# Patient Record
Sex: Male | Born: 2005 | Race: Asian | Hispanic: No | Marital: Single | Smoking: Never smoker
Health system: Southern US, Community
[De-identification: ages and names within clinical notes are randomized; demographics above are authoritative.]

## PROBLEM LIST (undated history)

## (undated) DIAGNOSIS — J020 Streptococcal pharyngitis: Secondary | ICD-10-CM

## (undated) DIAGNOSIS — J019 Acute sinusitis, unspecified: Secondary | ICD-10-CM

## (undated) DIAGNOSIS — H6690 Otitis media, unspecified, unspecified ear: Secondary | ICD-10-CM

## (undated) DIAGNOSIS — J189 Pneumonia, unspecified organism: Secondary | ICD-10-CM

---
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
## 2023-10-16 ENCOUNTER — Ambulatory Visit (INDEPENDENT_AMBULATORY_CARE_PROVIDER_SITE_OTHER): Payer: Managed Care, Other (non HMO) | Admitting: Family Medicine

## 2023-10-16 ENCOUNTER — Encounter: Payer: Self-pay | Admitting: Family Medicine

## 2023-10-16 VITALS — BP 122/68 | HR 70 | Temp 97.9°F | Ht 71.0 in | Wt 188.0 lb

## 2023-10-16 DIAGNOSIS — Z13220 Encounter for screening for lipoid disorders: Secondary | ICD-10-CM

## 2023-10-16 DIAGNOSIS — Z8249 Family history of ischemic heart disease and other diseases of the circulatory system: Secondary | ICD-10-CM | POA: Diagnosis not present

## 2023-10-16 LAB — CBC WITH DIFFERENTIAL/PLATELET
Basophils Absolute: 0 10*3/uL (ref 0.0–0.1)
Basophils Relative: 0.3 % (ref 0.0–3.0)
Eosinophils Absolute: 0.2 10*3/uL (ref 0.0–0.7)
Eosinophils Relative: 3.4 % (ref 0.0–5.0)
HCT: 47 % (ref 36.0–49.0)
Hemoglobin: 15.3 g/dL (ref 12.0–16.0)
Lymphocytes Relative: 29.8 % (ref 24.0–48.0)
Lymphs Abs: 2.2 10*3/uL (ref 0.7–4.0)
MCHC: 32.5 g/dL (ref 31.0–37.0)
MCV: 87.6 fL (ref 78.0–98.0)
Monocytes Absolute: 0.6 10*3/uL (ref 0.1–1.0)
Monocytes Relative: 7.8 % (ref 3.0–12.0)
Neutro Abs: 4.2 10*3/uL (ref 1.4–7.7)
Neutrophils Relative %: 58.7 % (ref 43.0–71.0)
Platelets: 278 10*3/uL (ref 150.0–575.0)
RBC: 5.36 Mil/uL (ref 3.80–5.70)
RDW: 13.9 % (ref 11.4–15.5)
WBC: 7.2 10*3/uL (ref 4.5–13.5)

## 2023-10-16 LAB — COMPREHENSIVE METABOLIC PANEL
ALT: 61 U/L — ABNORMAL HIGH (ref 0–53)
AST: 28 U/L (ref 0–37)
Albumin: 5.1 g/dL (ref 3.5–5.2)
Alkaline Phosphatase: 102 U/L (ref 52–171)
BUN: 26 mg/dL — ABNORMAL HIGH (ref 6–23)
CO2: 31 meq/L (ref 19–32)
Calcium: 9.9 mg/dL (ref 8.4–10.5)
Chloride: 101 meq/L (ref 96–112)
Creatinine, Ser: 1.21 mg/dL (ref 0.40–1.50)
GFR: 86.75 mL/min (ref >60.00)
Glucose, Bld: 78 mg/dL (ref 70–99)
Potassium: 4.1 meq/L (ref 3.5–5.1)
Sodium: 139 meq/L (ref 135–145)
Total Bilirubin: 0.6 mg/dL (ref 0.2–1.2)
Total Protein: 8.4 g/dL — ABNORMAL HIGH (ref 6.0–8.3)

## 2023-10-16 LAB — LIPID PANEL
Cholesterol: 153 mg/dL (ref 0–200)
HDL: 41.3 mg/dL (ref >39.00)
LDL Cholesterol: 94 mg/dL (ref 0–99)
NonHDL: 111.47
Total CHOL/HDL Ratio: 4
Triglycerides: 88 mg/dL (ref 0.0–149.0)
VLDL: 17.6 mg/dL (ref 0.0–40.0)

## 2023-10-16 NOTE — Progress Notes (Signed)
   Acute Office Visit  Subjective:     Patient ID: Bobby Gardner, male    DOB: 2006-08-09, 17 y.o.   MRN: [id number]  Chief Complaint  Patient presents with   Establish Care    Establish care. Interested in getting general labs done as well as checking for vitamin deficiencies and testosterone levels    HPI Patient is in today to establish care. Denies significant medical history. Does not take any prescription medications. 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 Fam hx HTN, grandfather with DM. Takes multivitamin Takes selenium, magnesium, fish oil No pre-workout or other stimulants Will be studying abroad in [place] next semester. Inquiring about toxins and hormonal workup. Denies any fatigue, muscle weakness, muscle aches, palpitations, headaches, other symptoms. Medical history as outlined below   ROS Per HPI      Objective:    BP 122/68   Pulse 70   Temp 97.9 F (36.6 C) (Oral)   Ht 5\' 11"  (1.803 m)   Wt 188 lb (85.3 kg)   SpO2 98%   BMI 26.22 kg/m    Physical Exam Vitals and nursing note reviewed.  Constitutional:      Appearance: Normal appearance.  HENT:     Head: Normocephalic and atraumatic.  Eyes:     Extraocular Movements: Extraocular movements intact.  Cardiovascular:     Rate and Rhythm: Normal rate and regular rhythm.     Pulses: Normal pulses.     Heart sounds: Normal heart sounds.  Pulmonary:     Effort: Pulmonary effort is normal.     Breath sounds: Normal breath sounds.  Musculoskeletal:        General: Normal range of motion.     Cervical back: Normal range of motion.  Skin:    General: Skin is warm and dry.  Neurological:     General: No focal deficit present.     Mental Status: He is alert and oriented to person, place, and time.  Psychiatric:        Mood and Affect: Mood normal.        Behavior: Behavior normal.     No results found for any visits on 10/16/23.      Assessment & Plan:  1. Family history of hypertension in  father  - CBC with Differential/Platelet; Future - Comprehensive metabolic panel; Future - Lipid panel; Future - Lipid panel - Comprehensive metabolic panel - CBC with Differential/Platelet  2. Screening cholesterol level  - Lipid panel; Future - Lipid panel  - Discussed that if we note any abnormal labs, we can further evaluate hormones as needed -Discussed that the likelihood of him having a hormonal imbalance at 17 as a healthy young adult is not likely without symptoms - Discussed that labs could be drawn, but that they would likely be that patient cost, deferred today   No orders of the defined types were placed in this encounter.   Return in about 6 months (around 04/15/2024) for Physical.  Moshe Cipro, FNP

## 2023-10-28 NOTE — Progress Notes (Unsigned)
   Acute Office Visit  Subjective:     Patient ID: Bobby Gardner, male    DOB: [birth year], 17 y.o.   MRN: [id number]  No chief complaint on file.   HPI Patient is in today for ***  ROS Per HPI      Objective:    There were no vitals taken for this visit.   Physical Exam Vitals and nursing note reviewed.  Constitutional:      Appearance: Normal appearance.  HENT:     Head: Normocephalic and atraumatic.  Eyes:     Extraocular Movements: Extraocular movements intact.  Cardiovascular:     Rate and Rhythm: Normal rate and regular rhythm.     Pulses: Normal pulses.     Heart sounds: Normal heart sounds.  Pulmonary:     Effort: Pulmonary effort is normal.     Breath sounds: Normal breath sounds.  Musculoskeletal:        General: Normal range of motion.     Cervical back: Normal range of motion.  Skin:    General: Skin is warm and dry.  Neurological:     General: No focal deficit present.     Mental Status: He is alert and oriented to person, place, and time.  Psychiatric:        Mood and Affect: Mood normal.        Behavior: Behavior normal.     No results found for any visits on 10/29/23.      Assessment & Plan:  ***  No orders of the defined types were placed in this encounter.   No follow-ups on file.  Moshe Cipro, FNP

## 2023-10-29 ENCOUNTER — Ambulatory Visit: Payer: Managed Care, Other (non HMO) | Admitting: Family Medicine

## 2023-10-29 ENCOUNTER — Encounter: Payer: Self-pay | Admitting: Family Medicine

## 2023-10-29 VITALS — BP 118/82 | HR 66 | Temp 98.0°F | Ht 71.0 in | Wt 182.2 lb

## 2023-10-29 DIAGNOSIS — R944 Abnormal results of kidney function studies: Secondary | ICD-10-CM | POA: Diagnosis not present

## 2023-10-29 DIAGNOSIS — R748 Abnormal levels of other serum enzymes: Secondary | ICD-10-CM

## 2023-10-29 LAB — PHOSPHORUS: Phosphorus: 4.2 mg/dL — ABNORMAL LOW (ref 4.5–5.5)

## 2023-10-29 LAB — TESTOSTERONE: Testosterone: 385.39 ng/dL (ref 200.00–970.00)

## 2023-10-29 LAB — MAGNESIUM: Magnesium: 1.7 mg/dL (ref 1.5–2.5)

## 2023-10-29 NOTE — Patient Instructions (Signed)
We are checking labs today, will be in contact with any results that require further attention  F/u with me as scheduled  Enjoy [place] next semester!

## 2023-10-30 ENCOUNTER — Encounter: Payer: Self-pay | Admitting: Family Medicine
